# Patient Record
Sex: Female | Born: 1995 | Race: Black or African American | Hispanic: No | Marital: Single | Smoking: Never smoker
Health system: Southern US, Community
[De-identification: ages and names within clinical notes are randomized; demographics above are authoritative.]

## PROBLEM LIST (undated history)

## (undated) DIAGNOSIS — L709 Acne, unspecified: Secondary | ICD-10-CM

## (undated) DIAGNOSIS — J309 Allergic rhinitis, unspecified: Secondary | ICD-10-CM

## (undated) HISTORY — PX: NO PAST SURGERIES: SHX2092

## (undated) HISTORY — DX: Acne, unspecified: L70.9

## (undated) HISTORY — DX: Allergic rhinitis, unspecified: J30.9

---
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
## 2016-01-15 ENCOUNTER — Emergency Department: Payer: No Typology Code available for payment source

## 2016-01-15 ENCOUNTER — Encounter: Payer: Self-pay | Admitting: Emergency Medicine

## 2016-01-15 ENCOUNTER — Emergency Department
Admission: EM | Admit: 2016-01-15 | Discharge: 2016-01-15 | Disposition: A | Discharge status: Home / Self Care | Payer: No Typology Code available for payment source | Attending: Emergency Medicine | Admitting: Emergency Medicine

## 2016-01-15 DIAGNOSIS — Y9389 Activity, other specified: Secondary | ICD-10-CM | POA: Insufficient documentation

## 2016-01-15 DIAGNOSIS — S40021A Contusion of right upper arm, initial encounter: Secondary | ICD-10-CM | POA: Insufficient documentation

## 2016-01-15 DIAGNOSIS — Y999 Unspecified external cause status: Secondary | ICD-10-CM | POA: Diagnosis not present

## 2016-01-15 DIAGNOSIS — S20212A Contusion of left front wall of thorax, initial encounter: Secondary | ICD-10-CM | POA: Diagnosis not present

## 2016-01-15 DIAGNOSIS — Y92411 Interstate highway as the place of occurrence of the external cause: Secondary | ICD-10-CM | POA: Diagnosis not present

## 2016-01-15 DIAGNOSIS — S299XXA Unspecified injury of thorax, initial encounter: Secondary | ICD-10-CM | POA: Diagnosis present

## 2016-01-15 MED ORDER — CYCLOBENZAPRINE HCL 5 MG PO TABS: 5.0000 mg | ORAL_TABLET | Freq: Three times a day (TID) | ORAL | 0 refills | Status: DC | PRN

## 2016-01-15 MED ORDER — NAPROXEN 500 MG PO TABS: 500.0000 mg | ORAL_TABLET | Freq: Two times a day (BID) | ORAL | 0 refills | Status: DC
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
## 2016-09-23 ENCOUNTER — Encounter: Payer: Self-pay | Admitting: Family Medicine

## 2016-09-23 ENCOUNTER — Ambulatory Visit (INDEPENDENT_AMBULATORY_CARE_PROVIDER_SITE_OTHER): Payer: 59 | Admitting: Family Medicine

## 2016-09-23 VITALS — BP 122/74 | HR 84 | Temp 98.8°F | Resp 16 | Wt 163.6 lb

## 2016-09-23 DIAGNOSIS — L723 Sebaceous cyst: Secondary | ICD-10-CM

## 2016-09-23 DIAGNOSIS — L02429 Furuncle of limb, unspecified: Secondary | ICD-10-CM | POA: Diagnosis not present

## 2016-09-23 MED ORDER — DOXYCYCLINE HYCLATE 100 MG PO TABS: 100.0000 mg | ORAL_TABLET | Freq: Two times a day (BID) | ORAL | 0 refills | Status: DC

## 2016-09-23 NOTE — Patient Instructions (Signed)
Use warm, wet compresses to inner thigh boil 2-3 x day. Monitor cyst under your arm. Let me know if you get tenderness or you wish a surgeon to check it out.

## 2016-09-23 NOTE — Progress Notes (Signed)
Subjective:     Patient ID: Brittany Bonilla, female   DOB: 07/23/1995, 21 y.o.   MRN: [id number]  HPI  Chief Complaint  Patient presents with  . Skin Problem    Patient comes in office today with complaints of bump underneath her left arm for the past week. Patient describes area a sensitive to touch and raised.   States it is not tender like previous bumps she has attributed to ingrown hairs. Also has a tender area in her left inner thigh area. States she will get recurrent bumps which she will squeeze and get pus out. No hx of or exposure to MRSA.   Review of Systems     Objective:   Physical Exam  Constitutional: She appears well-developed and well-nourished. No distress.  Skin:  Left axilla with 1 cm, mobile, non-tender, s.c mass c/w epidermoid cyst. No overlying erythema or drainage. Left inner thigh with evolving pustule, minimally tender to the touch       Assessment:    1. Sebaceous cyst of left axilla  2. Furuncle of thigh - doxycycline (VIBRA-TABS) 100 MG tablet; Take 1 tablet (100 mg total) by mouth 2 (two) times daily.  Dispense: 14 tablet; Refill: 0    Plan:    Discussed use of warm compresses to thigh and to monitor axilla cyst.

## 2017-01-27 DIAGNOSIS — H65119 Acute and subacute allergic otitis media (mucoid) (sanguinous) (serous), unspecified ear: Secondary | ICD-10-CM | POA: Diagnosis not present

## 2017-01-30 ENCOUNTER — Encounter: Payer: Self-pay | Admitting: Family Medicine

## 2017-01-30 ENCOUNTER — Ambulatory Visit: Payer: 59 | Admitting: Family Medicine

## 2017-01-30 VITALS — BP 100/70 | HR 81 | Temp 97.9°F | Resp 16 | Wt 162.8 lb

## 2017-01-30 DIAGNOSIS — B349 Viral infection, unspecified: Secondary | ICD-10-CM | POA: Diagnosis not present

## 2017-01-30 DIAGNOSIS — B309 Viral conjunctivitis, unspecified: Secondary | ICD-10-CM

## 2017-01-30 MED ORDER — SULFACETAMIDE SODIUM 10 % OP SOLN: 2.0000 [drp] | Freq: Four times a day (QID) | OPHTHALMIC | 0 refills | Status: DC

## 2017-01-30 MED ORDER — HYDROCODONE-HOMATROPINE 5-1.5 MG/5ML PO SYRP: ORAL_SOLUTION | ORAL | 0 refills | Status: DC

## 2017-01-30 NOTE — Progress Notes (Signed)
Subjective:     Patient ID: Brittany Bonilla, female   DOB: 03/16/1995, 21 y.o.   MRN: [id number] Chief Complaint  Patient presents with  . Cough    Patient comes in office today with complaints of cough and congestion for the past 3 days. Patient states that on Tuesday she was seen at student [facility] and diagnosed with a ear infection in her right ear, shortly after patient began to have symptoms of congestion, productive cough with yellow phlegm and redness and crusting of left eye. Patient reports that she was given antibiotic for ear but has not started it yet, she has been taking otc Nyquil for cough.    HPI Reports onset of cold sx on or about 9 days ago. States sinuses have improved with cough becoming more prominent in the last two days. Has started abx (? Amoxicillin) for right otitis with improvement but has also developed left eye redness and crusting.  Review of Systems     Objective:   Physical Exam  Constitutional: She appears well-developed and well-nourished. No distress.  Ears: R TM with mild injection, left TM WNL Eyes: left eye with mild scleral injection PERL; no discharge noted. Throat: no tonsillar enlargement or exudate Neck: no cervical adenopathy Lungs: clear     Assessment:    1. Acute viral syndrome: hydrocodone cough syrup  2. Acute viral conjunctivitis of left eye: Sulamyd Ophth. Solution 2 drops left eye 4 x day if eye not improving in the next 24 hours    Plan:    Discussed use of Mucinex D and frequent warm eye compresses Start eye abx if eye not improving in the next 24 hours. Continue abx previously provided.

## 2017-01-30 NOTE — Patient Instructions (Signed)
Start Mucinex D for congestion and to help your ear. Start frequent wet, warm eye compresses to your left eye. Let me know if you are not improving next week.

## 2017-06-19 ENCOUNTER — Other Ambulatory Visit: Payer: Self-pay | Admitting: Family Medicine

## 2017-06-19 ENCOUNTER — Ambulatory Visit: Payer: 59 | Admitting: Family Medicine

## 2017-06-19 ENCOUNTER — Encounter: Payer: Self-pay | Admitting: Family Medicine

## 2017-06-19 VITALS — BP 102/72 | HR 99 | Temp 98.9°F | Resp 15 | Wt 175.0 lb

## 2017-06-19 DIAGNOSIS — R319 Hematuria, unspecified: Secondary | ICD-10-CM | POA: Diagnosis not present

## 2017-06-19 LAB — POCT URINALYSIS DIPSTICK
BILIRUBIN UA: NEGATIVE
Glucose, UA: NEGATIVE
KETONES UA: NEGATIVE
Nitrite, UA: NEGATIVE
Protein, UA: NEGATIVE
Spec Grav, UA: <=1.005 — AB (ref 1.010–1.025)
Urobilinogen, UA: 0.2 E.U./dL
pH, UA: 6 (ref 5.0–8.0)

## 2017-06-19 LAB — POCT URINE PREGNANCY: Preg Test, Ur: NEGATIVE

## 2017-06-19 MED ORDER — DOXYCYCLINE HYCLATE 100 MG PO TABS: 100.0000 mg | ORAL_TABLET | Freq: Two times a day (BID) | ORAL | 0 refills | Status: DC

## 2017-06-19 NOTE — Patient Instructions (Signed)
We will call you with the test results early next week on your cell phone: [phone]

## 2017-06-19 NOTE — Progress Notes (Signed)
  Subjective:     Patient ID: Brittany Bonilla, female   DOB: 09/16/1995, 21 y.o.   MRN: [id number] Chief Complaint  Patient presents with  . Hematuria    Patient comes in office today with concerns of hematuria for the past two weeks. Patient states at the end or emptying her bladder she notices blood. Patient denies pelvic pain, back pain, fever , dysuria or frequency.    HPI States she has noticed bright red blood at end urination every 2-3 days. Currently sexually active without protection. She is pending her menses, no vaginal discharge Reports negative STD screen in February.  Review of Systems     Objective:   Physical Exam  Constitutional: She appears well-developed and well-nourished. No distress.       Assessment:    1. Hematuria, unspecified type - Chlamydia/Gonococcus/Trichomonas, NAA - POCT urine pregnancy - POCT urinalysis dipstick - doxycycline (VIBRA-TABS) 100 MG tablet; Take 1 tablet (100 mg total) by mouth 2 (two) times daily.  Dispense: 14 tablet; Refill: 0 - Urine Culture    Plan:    Further f/u pending lab results. Encourage use of condoms.

## 2017-06-21 LAB — URINE CULTURE: ORGANISM ID, BACTERIA: NO GROWTH

## 2017-06-23 ENCOUNTER — Telehealth: Payer: Self-pay

## 2017-06-23 ENCOUNTER — Other Ambulatory Visit: Payer: Self-pay | Admitting: Family Medicine

## 2017-06-23 LAB — CHLAMYDIA/GONOCOCCUS/TRICHOMONAS, NAA
Chlamydia by NAA: NEGATIVE
GONOCOCCUS BY NAA: NEGATIVE
Trich vag by NAA: POSITIVE — AB

## 2017-06-23 MED ORDER — METRONIDAZOLE 500 MG PO TABS: 500.0000 mg | ORAL_TABLET | Freq: Two times a day (BID) | ORAL | 0 refills | Status: DC

## 2017-06-23 NOTE — Telephone Encounter (Signed)
Pt advised as directed below.   Thanks,   -Laura  

## 2017-06-23 NOTE — Telephone Encounter (Signed)
-----   Message from Anola Gurney, [place] sent at 06/23/2017  5:13 PM EDT ----- I have sent in the metronidazole to cover this. Let her know this is usually sexually transmitted so her partner needs to be treated as well.

## 2017-08-22 ENCOUNTER — Emergency Department
Admission: EM | Admit: 2017-08-22 | Discharge: 2017-08-22 | Disposition: A | Discharge status: Home / Self Care | Payer: 59 | Attending: Emergency Medicine | Admitting: Emergency Medicine

## 2017-08-22 ENCOUNTER — Other Ambulatory Visit: Payer: Self-pay

## 2017-08-22 ENCOUNTER — Emergency Department: Payer: 59

## 2017-08-22 DIAGNOSIS — R091 Pleurisy: Secondary | ICD-10-CM | POA: Diagnosis not present

## 2017-08-22 DIAGNOSIS — R05 Cough: Secondary | ICD-10-CM | POA: Insufficient documentation

## 2017-08-22 DIAGNOSIS — R079 Chest pain, unspecified: Secondary | ICD-10-CM | POA: Diagnosis not present

## 2017-08-22 LAB — CBC
HEMATOCRIT: 38.7 % (ref 35.0–47.0)
Hemoglobin: 13 g/dL (ref 12.0–16.0)
MCH: 28.5 pg (ref 26.0–34.0)
MCHC: 33.6 g/dL (ref 32.0–36.0)
MCV: 84.9 fL (ref 80.0–100.0)
Platelets: 266 10*3/uL (ref 150–440)
RBC: 4.56 MIL/uL (ref 3.80–5.20)
RDW: 13.5 % (ref 11.5–14.5)
WBC: 7.6 10*3/uL (ref 3.6–11.0)

## 2017-08-22 LAB — POCT PREGNANCY, URINE: PREG TEST UR: NEGATIVE

## 2017-08-22 LAB — BASIC METABOLIC PANEL
Anion gap: 6 (ref 5–15)
BUN: 16 mg/dL (ref 6–20)
CHLORIDE: 108 mmol/L (ref 98–111)
CO2: 24 mmol/L (ref 22–32)
CREATININE: 0.81 mg/dL (ref 0.44–1.00)
Calcium: 9.3 mg/dL (ref 8.9–10.3)
GFR calc non Af Amer: >60 mL/min (ref >60)
Glucose, Bld: 96 mg/dL (ref 70–99)
POTASSIUM: 4.5 mmol/L (ref 3.5–5.1)
SODIUM: 138 mmol/L (ref 135–145)

## 2017-08-22 LAB — TROPONIN I

## 2017-08-22 MED ORDER — HYDROCOD POLST-CPM POLST ER 10-8 MG/5ML PO SUER
5.0000 mL | Freq: Once | ORAL | Status: AC
  Administered 2017-08-22: 5 mL via ORAL
  Filled 2017-08-22: qty 5

## 2017-08-22 MED ORDER — HYDROCOD POLST-CPM POLST ER 10-8 MG/5ML PO SUER: 5.0000 mL | Freq: Two times a day (BID) | ORAL | 0 refills | Status: DC | PRN

## 2017-08-22 NOTE — ED Provider Notes (Signed)
[facility] Emergency Department Provider Note   First MD Initiated Contact with Patient 08/22/17 [phone]     (approximate)  I have reviewed the triage vital signs and the nursing notes.   HISTORY  Chief Complaint Chest Pain    HPI Brittany Bonilla is a 22 y.o. female presents to the emergency department with nonradiating left-sided chest pain which began 6 hours before arrival.  Patient denies any dyspnea no diaphoresis.  Patient denies any palpitations.  Patient denies any nausea or vomiting.  Patient denies any aggravating or alleviating factors for her pain.  Patient does admit to cough which has been occurring for the past 3 days.  Patient denies any fever.  Patient states she attributes to cough to sleeping next to the [facility] while at school.   Past Medical History:  Diagnosis Date  . Acne   . Allergic rhinitis     Patient Active Problem List   Diagnosis Date Noted  . Acne 05/31/2015  . Hyperpigmentation of skin, postinflammatory 05/31/2015  . Allergic rhinitis 05/31/2015    Past Surgical History:  Procedure Laterality Date  . NO PAST SURGERIES      Prior to Admission medications   Medication Sig Start Date End Date Taking? Authorizing Provider  chlorpheniramine-HYDROcodone (TUSSIONEX PENNKINETIC ER) 10-8 MG/5ML SUER Take 5 mLs by mouth every 12 (twelve) hours as needed. 08/22/17   Darci CurrentBrown, [place] N, MD  doxycycline (VIBRA-TABS) 100 MG tablet Take 1 tablet (100 mg total) by mouth 2 (two) times daily. 06/19/17   Anola Gurneyhauvin, Robert, PA  ibuprofen (ADVIL,MOTRIN) 800 MG tablet  01/27/17   [provider]  metroNIDAZOLE (FLAGYL) 500 MG tablet Take 1 tablet (500 mg total) by mouth 2 (two) times daily. 06/23/17   Anola Gurneyhauvin, Robert, PA    Allergies No known drug allergies  Family History  Problem Relation Age of Onset  . Healthy Mother   . Allergic rhinitis Brother   . Eczema Brother   . Healthy Father     Social History Social History    Tobacco Use  . Smoking status: Never Smoker  . Smokeless tobacco: Never Used  Substance Use Topics  . Alcohol use: No  . Drug use: Not Bonilla file    Review of Systems Constitutional: No fever/chills Eyes: No visual changes. ENT: No sore throat. Cardiovascular: Denies chest pain. Respiratory: Denies shortness of breath.  Positive for cough Gastrointestinal: No abdominal pain.  No nausea, no vomiting.  No diarrhea.  No constipation. Genitourinary: Negative for dysuria. Musculoskeletal: Negative for neck pain.  Negative for back pain. Integumentary: Negative for rash. Neurological: Negative for headaches, focal weakness or numbness.   ____________________________________________   PHYSICAL EXAM:  VITAL SIGNS: ED Triage Vitals  Enc Vitals Group     BP 08/22/17 0328 110/70     Pulse Rate 08/22/17 0328 93     Resp 08/22/17 0328 20     Temp 08/22/17 0328 98.3 F (36.8 C)     Temp Source 08/22/17 0328 Oral     SpO2 08/22/17 0328 100 %     Weight 08/22/17 0146 77.1 kg (170 lb)     Height 08/22/17 0146 1.499 m (4\' 11" )     Head Circumference --      Peak Flow --      Pain Score 08/22/17 0146 6     Pain Loc --      Pain Edu? --      Excl. in GC? --  Constitutional: Alert and oriented. Well appearing and in no acute distress. Eyes: Conjunctivae are normal.  Head: Atraumatic. Mouth/Throat: Mucous membranes are moist. Oropharynx non-erythematous. Neck: No stridor.   Cardiovascular: Normal rate, regular rhythm. Good peripheral circulation. Grossly normal heart sounds. Respiratory: Normal respiratory effort.  No retractions. Lungs CTAB. Gastrointestinal: Soft and nontender. No distention.  Musculoskeletal: No lower extremity tenderness nor edema. No gross deformities of extremities. Neurologic:  Normal speech and language. No gross focal neurologic deficits are appreciated.  Skin:  Skin is warm, dry and intact. No rash  noted.   ____________________________________________   LABS (all labs ordered are listed, but only abnormal results are displayed)  Labs Reviewed  BASIC METABOLIC PANEL  CBC  TROPONIN I  POC URINE PREG, ED  POCT PREGNANCY, URINE   ____________________________________________  EKG ED ECG REPORT I, [place] N Ulyess Muto, the attending physician, personally viewed and interpreted this ECG.   Date: 08/22/2017  EKG Time: 1:48 AM  rate: 89  Rhythm: Normal sinus rhythm  Axis: Normal  Intervals: Normal  ST&T Change: None  ____________________________________________  RADIOLOGY I, [place] N Bee Hammerschmidt, personally viewed and evaluated these images (plain radiographs) as part of my medical decision making, as well as reviewing the written report by the radiologist.  ED MD interpretation: No acute findings noted Bonilla chest x-ray per radiologist  Official radiology report(s): Dg Chest 2 View  Result Date: 08/22/2017 CLINICAL DATA:  22 year old female with chest pain. EXAM: CHEST - 2 VIEW COMPARISON:  Chest radiograph dated 01/15/2016 FINDINGS: The heart size and mediastinal contours are within normal limits. Both lungs are clear. The visualized skeletal structures are unremarkable. IMPRESSION: No active cardiopulmonary disease. Electronically Signed   By: Elgie Collard M.D.   Bonilla: 08/22/2017 02:35     Procedures   ____________________________________________   INITIAL IMPRESSION / ASSESSMENT AND PLAN / ED COURSE  As part of my medical decision making, I reviewed the following data within the electronic MEDICAL RECORD NUMBER   22 year old female presenting with above-stated history and physical exam of left side chest pain.  EKG revealed no evidence of ischemia or infarction or arrhythmia.  Laboratory data unremarkable chest x-ray likewise negative.  Suspect possible pleurisy as etiology for the patient's chest discomfort.  Patient given Tussionex in the emergency department Bonilla reevaluation  patient's chest pain 1-2 out of 10 ____________________________________________  FINAL CLINICAL IMPRESSION(S) / ED DIAGNOSES  Final diagnoses:  Pleurisy     MEDICATIONS GIVEN DURING THIS VISIT:  Medications  chlorpheniramine-HYDROcodone (TUSSIONEX) 10-8 MG/5ML suspension 5 mL (5 mLs Oral Given 08/22/17 0353)     ED Discharge Orders        Ordered    chlorpheniramine-HYDROcodone (TUSSIONEX PENNKINETIC ER) 10-8 MG/5ML SUER  Every 12 hours PRN     08/22/17 0438       Note:  This document was prepared using Dragon voice recognition software and may include unintentional dictation errors.    Darci Current, MD 08/22/17 [phone]

## 2017-08-22 NOTE — ED Triage Notes (Signed)
Pt arrives to ED via POV from home with c/o chest pain x6 hrs PTA. Pt reports left-sided chest pain without radiation. No N/V, no SHOB, no diaphoresis. Pt is A&O, in NAD; RR even, regular, and unlabored. No cardiac h/x.

## 2017-10-13 ENCOUNTER — Ambulatory Visit (INDEPENDENT_AMBULATORY_CARE_PROVIDER_SITE_OTHER): Payer: 59 | Admitting: Family Medicine

## 2017-10-13 DIAGNOSIS — Z23 Encounter for immunization: Secondary | ICD-10-CM | POA: Diagnosis not present

## 2017-11-27 DIAGNOSIS — Z124 Encounter for screening for malignant neoplasm of cervix: Secondary | ICD-10-CM | POA: Diagnosis not present

## 2018-05-12 ENCOUNTER — Ambulatory Visit: Payer: 59 | Admitting: Physician Assistant

## 2018-05-12 ENCOUNTER — Other Ambulatory Visit: Payer: Self-pay

## 2018-05-12 ENCOUNTER — Encounter: Payer: Self-pay | Admitting: Physician Assistant

## 2018-05-12 VITALS — BP 121/85 | HR 88 | Temp 98.9°F | Resp 16 | Wt 175.2 lb

## 2018-05-12 DIAGNOSIS — T7840XA Allergy, unspecified, initial encounter: Secondary | ICD-10-CM

## 2018-05-12 NOTE — Patient Instructions (Addendum)
Please schedule CPE with PAP smear.  Take xyzal or other allergy medication daily. Take flonase 2 sprays each nostril daily.   Health Maintenance, Female Adopting a healthy lifestyle and getting preventive care can go a long way to promote health and wellness. Talk with your health care provider about what schedule of regular examinations is right for you. This is a good chance for you to check in with your provider about disease prevention and staying healthy. In between checkups, there are plenty of things you can do on your own. Experts have done a lot of research about which lifestyle changes and preventive measures are most likely to keep you healthy. Ask your health care provider for more information. Weight and diet Eat a healthy diet  Be sure to include plenty of vegetables, fruits, low-fat dairy products, and lean protein.  Do not eat a lot of foods high in solid fats, added sugars, or salt.  Get regular exercise. This is one of the most important things you can do for your health. ? Most adults should exercise for at least 150 minutes each week. The exercise should increase your heart rate and make you sweat (moderate-intensity exercise). ? Most adults should also do strengthening exercises at least twice a week. This is in addition to the moderate-intensity exercise. Maintain a healthy weight  Body mass index (BMI) is a measurement that can be used to identify possible weight problems. It estimates body fat based on height and weight. Your health care provider can help determine your BMI and help you achieve or maintain a healthy weight.  For females 80 years of age and older: ? A BMI below 18.5 is considered underweight. ? A BMI of 18.5 to 24.9 is normal. ? A BMI of 25 to 29.9 is considered overweight. ? A BMI of 30 and above is considered obese. Watch levels of cholesterol and blood lipids  You should start having your blood tested for lipids and cholesterol at 22 years of  age, then have this test every 5 years.  You may need to have your cholesterol levels checked more often if: ? Your lipid or cholesterol levels are high. ? You are older than 22 years of age. ? You are at high risk for heart disease. Cancer screening Lung Cancer  Lung cancer screening is recommended for adults 51-39 years old who are at high risk for lung cancer because of a history of smoking.  A yearly low-dose CT scan of the lungs is recommended for people who: ? Currently smoke. ? Have quit within the past 15 years. ? Have at least a 30-pack-year history of smoking. A pack year is smoking an average of one pack of cigarettes a day for 1 year.  Yearly screening should continue until it has been 15 years since you quit.  Yearly screening should stop if you develop a health problem that would prevent you from having lung cancer treatment. Breast Cancer  Practice breast self-awareness. This means understanding how your breasts normally appear and feel.  It also means doing regular breast self-exams. Let your health care provider know about any changes, no matter how small.  If you are in your 20s or 30s, you should have a clinical breast exam (CBE) by a health care provider every 1-3 years as part of a regular health exam.  If you are 42 or older, have a CBE every year. Also consider having a breast X-ray (mammogram) every year.  If you have a family history  of breast cancer, talk to your health care provider about genetic screening.  If you are at high risk for breast cancer, talk to your health care provider about having an MRI and a mammogram every year.  Breast cancer gene (BRCA) assessment is recommended for women who have family members with BRCA-related cancers. BRCA-related cancers include: ? Breast. ? Ovarian. ? Tubal. ? Peritoneal cancers.  Results of the assessment will determine the need for genetic counseling and BRCA1 and BRCA2 testing. Cervical Cancer Your  health care provider may recommend that you be screened regularly for cancer of the pelvic organs (ovaries, uterus, and vagina). This screening involves a pelvic examination, including checking for microscopic changes to the surface of your cervix (Pap test). You may be encouraged to have this screening done every 3 years, beginning at age 46.  For women ages 65-65, health care providers may recommend pelvic exams and Pap testing every 3 years, or they may recommend the Pap and pelvic exam, combined with testing for human papilloma virus (HPV), every 5 years. Some types of HPV increase your risk of cervical cancer. Testing for HPV may also be done on women of any age with unclear Pap test results.  Other health care providers may not recommend any screening for nonpregnant women who are considered low risk for pelvic cancer and who do not have symptoms. Ask your health care provider if a screening pelvic exam is right for you.  If you have had past treatment for cervical cancer or a condition that could lead to cancer, you need Pap tests and screening for cancer for at least 20 years after your treatment. If Pap tests have been discontinued, your risk factors (such as having a new sexual partner) need to be reassessed to determine if screening should resume. Some women have medical problems that increase the chance of getting cervical cancer. In these cases, your health care provider may recommend more frequent screening and Pap tests. Colorectal Cancer  This type of cancer can be detected and often prevented.  Routine colorectal cancer screening usually begins at 22 years of age and continues through 22 years of age.  Your health care provider may recommend screening at an earlier age if you have risk factors for colon cancer.  Your health care provider may also recommend using home test kits to check for hidden blood in the stool.  A small camera at the end of a tube can be used to examine your  colon directly (sigmoidoscopy or colonoscopy). This is done to check for the earliest forms of colorectal cancer.  Routine screening usually begins at age 86.  Direct examination of the colon should be repeated every 5-10 years through 22 years of age. However, you may need to be screened more often if early forms of precancerous polyps or small growths are found. Skin Cancer  Check your skin from head to toe regularly.  Tell your health care provider about any new moles or changes in moles, especially if there is a change in a mole's shape or color.  Also tell your health care provider if you have a mole that is larger than the size of a pencil eraser.  Always use sunscreen. Apply sunscreen liberally and repeatedly throughout the day.  Protect yourself by wearing long sleeves, pants, a wide-brimmed hat, and sunglasses whenever you are outside. Heart disease, diabetes, and high blood pressure  High blood pressure causes heart disease and increases the risk of stroke. High blood pressure is more  likely to develop in: ? People who have blood pressure in the high end of the normal range (130-139/85-89 mm Hg). ? People who are overweight or obese. ? People who are African American.  If you are 92-78 years of age, have your blood pressure checked every 3-5 years. If you are 29 years of age or older, have your blood pressure checked every year. You should have your blood pressure measured twice--once when you are at a hospital or clinic, and once when you are not at a hospital or clinic. Record the average of the two measurements. To check your blood pressure when you are not at a hospital or clinic, you can use: ? An automated blood pressure machine at a pharmacy. ? A home blood pressure monitor.  If you are between 12 years and 45 years old, ask your health care provider if you should take aspirin to prevent strokes.  Have regular diabetes screenings. This involves taking a blood sample to  check your fasting blood sugar level. ? If you are at a normal weight and have a low risk for diabetes, have this test once every three years after 22 years of age. ? If you are overweight and have a high risk for diabetes, consider being tested at a younger age or more often. Preventing infection Hepatitis B  If you have a higher risk for hepatitis B, you should be screened for this virus. You are considered at high risk for hepatitis B if: ? You were born in a country where hepatitis B is common. Ask your health care provider which countries are considered high risk. ? Your parents were born in a high-risk country, and you have not been immunized against hepatitis B (hepatitis B vaccine). ? You have HIV or AIDS. ? You use needles to inject street drugs. ? You live with someone who has hepatitis B. ? You have had sex with someone who has hepatitis B. ? You get hemodialysis treatment. ? You take certain medicines for conditions, including cancer, organ transplantation, and autoimmune conditions. Hepatitis C  Blood testing is recommended for: ? Everyone born from [birth year] through 1964. ? Anyone with known risk factors for hepatitis C. Sexually transmitted infections (STIs)  You should be screened for sexually transmitted infections (STIs) including gonorrhea and chlamydia if: ? You are sexually active and are younger than 22 years of age. ? You are older than 22 years of age and your health care provider tells you that you are at risk for this type of infection. ? Your sexual activity has changed since you were last screened and you are at an increased risk for chlamydia or gonorrhea. Ask your health care provider if you are at risk.  If you do not have HIV, but are at risk, it may be recommended that you take a prescription medicine daily to prevent HIV infection. This is called pre-exposure prophylaxis (PrEP). You are considered at risk if: ? You are sexually active and do not regularly use  condoms or know the HIV status of your partner(s). ? You take drugs by injection. ? You are sexually active with a partner who has HIV. Talk with your health care provider about whether you are at high risk of being infected with HIV. If you choose to begin PrEP, you should first be tested for HIV. You should then be tested every 3 months for as long as you are taking PrEP. Pregnancy  If you are premenopausal and you may become pregnant, ask  your health care provider about preconception counseling.  If you may become pregnant, take 400 to 800 micrograms (mcg) of folic acid every day.  If you want to prevent pregnancy, talk to your health care provider about birth control (contraception). Osteoporosis and menopause  Osteoporosis is a disease in which the bones lose minerals and strength with aging. This can result in serious bone fractures. Your risk for osteoporosis can be identified using a bone density scan.  If you are 94 years of age or older, or if you are at risk for osteoporosis and fractures, ask your health care provider if you should be screened.  Ask your health care provider whether you should take a calcium or vitamin D supplement to lower your risk for osteoporosis.  Menopause may have certain physical symptoms and risks.  Hormone replacement therapy may reduce some of these symptoms and risks. Talk to your health care provider about whether hormone replacement therapy is right for you. Follow these instructions at home:  Schedule regular health, dental, and eye exams.  Stay current with your immunizations.  Do not use any tobacco products including cigarettes, chewing tobacco, or electronic cigarettes.  If you are pregnant, do not drink alcohol.  If you are breastfeeding, limit how much and how often you drink alcohol.  Limit alcohol intake to no more than 1 drink per day for nonpregnant women. One drink equals 12 ounces of beer, 5 ounces of wine, or 1 ounces of  hard liquor.  Do not use street drugs.  Do not share needles.  Ask your health care provider for help if you need support or information about quitting drugs.  Tell your health care provider if you often feel depressed.  Tell your health care provider if you have ever been abused or do not feel safe at home. This information is not intended to replace advice given to you by your health care provider. Make sure you discuss any questions you have with your health care provider. Document Released: 08/26/2010 Document Revised: 07/19/2015 Document Reviewed: 11/14/2014 Elsevier Interactive Patient Education  2019 [employer].
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
## 2018-09-29 ENCOUNTER — Other Ambulatory Visit (HOSPITAL_COMMUNITY)
Admission: RE | Admit: 2018-09-29 | Discharge: 2018-09-29 | Disposition: A | Discharge status: Home / Self Care | Payer: 59 | Source: Ambulatory Visit | Attending: Physician Assistant | Admitting: Physician Assistant

## 2018-09-29 ENCOUNTER — Other Ambulatory Visit: Payer: Self-pay

## 2018-09-29 ENCOUNTER — Encounter: Payer: Self-pay | Admitting: Physician Assistant

## 2018-09-29 ENCOUNTER — Ambulatory Visit: Payer: 59 | Admitting: Physician Assistant

## 2018-09-29 VITALS — BP 104/68 | HR 66 | Temp 98.0°F | Resp 16 | Ht 60.0 in | Wt 172.0 lb

## 2018-09-29 DIAGNOSIS — Z6833 Body mass index (BMI) 33.0-33.9, adult: Secondary | ICD-10-CM

## 2018-09-29 DIAGNOSIS — Z113 Encounter for screening for infections with a predominantly sexual mode of transmission: Secondary | ICD-10-CM

## 2018-09-29 DIAGNOSIS — N898 Other specified noninflammatory disorders of vagina: Secondary | ICD-10-CM | POA: Insufficient documentation

## 2018-09-29 DIAGNOSIS — E6609 Other obesity due to excess calories: Secondary | ICD-10-CM

## 2018-09-29 DIAGNOSIS — N760 Acute vaginitis: Secondary | ICD-10-CM | POA: Diagnosis not present

## 2018-09-29 DIAGNOSIS — A6004 Herpesviral vulvovaginitis: Secondary | ICD-10-CM

## 2018-09-29 DIAGNOSIS — B9689 Other specified bacterial agents as the cause of diseases classified elsewhere: Secondary | ICD-10-CM

## 2018-09-29 LAB — POCT URINALYSIS DIPSTICK
Appearance: ABNORMAL
Bilirubin, UA: NEGATIVE
Glucose, UA: NEGATIVE
Ketones, UA: NEGATIVE
Leukocytes, UA: NEGATIVE
Nitrite, UA: NEGATIVE
Odor: ABNORMAL
Protein, UA: POSITIVE — AB
Spec Grav, UA: 1.02 (ref 1.010–1.025)
Urobilinogen, UA: 0.2 E.U./dL
pH, UA: 6.5 (ref 5.0–8.0)

## 2018-09-29 NOTE — Patient Instructions (Signed)

## 2018-09-29 NOTE — Progress Notes (Signed)
Patient: Brittany GobbleSteva C Bonilla Female    DOB: 01/01/1996   22 y.o.   MRN: [id number] Visit Date: 09/29/2018  Today's Provider: Margaretann LovelessJennifer M Pardo, PA-C   No chief complaint on file.  Subjective:     HPI   Patient is here for STD screening. Patient states she has an odor. No discharge, no pain, and no rash. Patient states she had a normal menstrual period in July. However she took a plan B on 09/24/2018 and starting another menstrual period 09/26/2018. She did have unprotected sexual encounter.  No Known Allergies   Current Outpatient Medications:  .  ibuprofen (ADVIL,MOTRIN) 800 MG tablet, , Disp: , Rfl:   Review of Systems  Constitutional: Negative for appetite change, chills, fatigue and fever.  Respiratory: Negative for chest tightness and shortness of breath.   Cardiovascular: Negative for chest pain and palpitations.  Gastrointestinal: Negative for abdominal pain, nausea and vomiting.  Genitourinary: Positive for vaginal bleeding. Negative for dysuria, flank pain, frequency, genital sores, menstrual problem, vaginal discharge and vaginal pain.       Vaginal odor  Neurological: Negative for dizziness and weakness.    Social History   Tobacco Use  . Smoking status: Never Smoker  . Smokeless tobacco: Never Used  Substance Use Topics  . Alcohol use: No      Objective:   BP 104/68 (BP Location: Right Arm, Patient Position: Sitting, Cuff Size: Large)   Pulse 66   Temp 98 F (36.7 C) (Oral)   Resp 16   Ht 5' (1.524 m)   Wt 172 lb (78 kg)   LMP 09/29/2018   SpO2 97%   BMI 33.59 kg/m  Vitals:   09/29/18 1056  BP: 104/68  Pulse: 66  Resp: 16  Temp: 98 F (36.7 C)  TempSrc: Oral  SpO2: 97%  Weight: 172 lb (78 kg)  Height: 5' (1.524 m)     Physical Exam Vitals signs reviewed.  Constitutional:      General: She is not in acute distress.    Appearance: Normal appearance. She is well-developed. She is obese. She is not ill-appearing or diaphoretic.  Neck:      Musculoskeletal: Normal range of motion and neck supple.     Thyroid: No thyromegaly.     Vascular: No JVD.     Trachea: No tracheal deviation.  Cardiovascular:     Rate and Rhythm: Normal rate and regular rhythm.     Heart sounds: Normal heart sounds. No murmur. No friction rub. No gallop.   Pulmonary:     Effort: Pulmonary effort is normal. No respiratory distress.     Breath sounds: Normal breath sounds. No wheezing or rales.  Genitourinary:    Exam position: Supine.     Pubic Area: No rash.      Labia:        Right: Lesion present. No rash, tenderness or injury.        Left: No rash, tenderness, lesion or injury.      Vagina: Vaginal discharge (brown discharge) present.    Lymphadenopathy:     Cervical: No cervical adenopathy.  Neurological:     Mental Status: She is alert.      Results for orders placed or performed in visit on 09/29/18  POCT Urinalysis Dipstick  Result Value Ref Range   Color, UA Dark Yellow    Clarity, UA Cloudy    Glucose, UA Negative Negative   Bilirubin, UA Neg  Ketones, UA Neg    Spec Grav, UA 1.020 1.010 - 1.025   Blood, UA Large    pH, UA 6.5 5.0 - 8.0   Protein, UA Positive (A) Negative   Urobilinogen, UA 0.2 0.2 or 1.0 E.U./dL   Nitrite, UA Neg    Leukocytes, UA Negative Negative   Appearance Abnormal    Odor Abnormal        Assessment & Plan    1. Vaginal odor Nuswab collected for GC/chlamydia, trich, BV, and yeast testing. UA normal.  Herpes swab collected over vaginal lesion. Also screening for HIV and syphillis. I will f/u pending results.  - Cervicovaginal ancillary only  2. Screen for STD (sexually transmitted disease) See above medical treatment plan. - Cervicovaginal ancillary only - HIV antibody (with reflex) - RPR - Cervicovaginal ancillary only  3. Class 1 obesity due to excess calories without serious comorbidity with body mass index (BMI) of 33.0 to 33.9 in adult Counseled patient on healthy lifestyle  modifications including dieting and exercise.   4. Vaginal lesion Herpes viral culture obtained. I will f/u pending labs.  - Cervicovaginal ancillary only     Mar Daring, PA-C  [facility]

## 2018-09-29 NOTE — Addendum Note (Signed)
Addended by: Mar Daring on: 09/29/2018 04:14 PM   Modules accepted: Orders

## 2018-09-30 ENCOUNTER — Telehealth: Payer: Self-pay

## 2018-09-30 LAB — RPR: RPR Ser Ql: NONREACTIVE

## 2018-09-30 LAB — HIV ANTIBODY (ROUTINE TESTING W REFLEX): HIV Screen 4th Generation wRfx: NONREACTIVE

## 2018-09-30 NOTE — Telephone Encounter (Signed)
Patient notified of lab results

## 2018-09-30 NOTE — Telephone Encounter (Signed)
-----   Message from Mar Daring, [place] sent at 09/30/2018  9:15 AM EDT ----- HIV and syphilis screen are both negative. Other labs (swabs) are still pending

## 2018-10-02 LAB — CERVICOVAGINAL ANCILLARY ONLY
Candida vaginitis: NEGATIVE
Chlamydia: NEGATIVE
Neisseria Gonorrhea: NEGATIVE
Trichomonas: NEGATIVE

## 2018-10-03 LAB — HERPES SIMPLEX VIRUS(HSV) DNA BY PCR
HSV 1 DNA: NEGATIVE
HSV 2 DNA: POSITIVE — AB

## 2018-10-05 ENCOUNTER — Telehealth: Payer: Self-pay

## 2018-10-05 ENCOUNTER — Telehealth: Payer: Self-pay | Admitting: Physician Assistant

## 2018-10-05 DIAGNOSIS — A6004 Herpesviral vulvovaginitis: Secondary | ICD-10-CM | POA: Insufficient documentation

## 2018-10-05 MED ORDER — METRONIDAZOLE 500 MG PO TABS: 500.0000 mg | ORAL_TABLET | Freq: Two times a day (BID) | ORAL | 0 refills | Status: AC

## 2018-10-05 MED ORDER — VALACYCLOVIR HCL 1 G PO TABS: 1000.0000 mg | ORAL_TABLET | Freq: Two times a day (BID) | ORAL | 0 refills | Status: DC

## 2018-10-05 NOTE — Addendum Note (Signed)
Addended by: Mar Daring on: 10/05/2018 01:17 PM   Modules accepted: Orders

## 2018-10-05 NOTE — Telephone Encounter (Signed)
Patient calling to get results of STD test

## 2018-10-05 NOTE — Telephone Encounter (Signed)
Patient was advised about resulted labs. Please advised results still pending?

## 2018-10-05 NOTE — Telephone Encounter (Signed)
-----   Message from Mar Daring, PA-C sent at 10/05/2018  1:16 PM EDT ----- Swabs were positive for bacterial vaginosis. This is an overgrowth of normal vaginal bacteria and treated with metronidazole, an antibiotic. I will send this in for you. The other swab was also positive for genital herpes. This is treated with an antiviral called valtrex. I will send this in for you as well. Once we treat the initial outbreak (the bump on the external vaginal wall) we will see how quickly it returns, if it returns at all. If it returns rather quickly I would recommend doing suppressive therapy where you stay on Valtrex daily. If it does not return for a while we can just treat outbreaks as they arise. I will send both medications to pharmacy we have on file for you.

## 2018-10-05 NOTE — Telephone Encounter (Signed)
Viewed by [name] on 10/05/2018 1:16 PM

## 2018-10-05 NOTE — Telephone Encounter (Signed)
See result note.  

## 2018-10-13 ENCOUNTER — Telehealth: Payer: Self-pay | Admitting: Physician Assistant

## 2018-10-13 DIAGNOSIS — T3695XA Adverse effect of unspecified systemic antibiotic, initial encounter: Secondary | ICD-10-CM

## 2018-10-13 DIAGNOSIS — B379 Candidiasis, unspecified: Secondary | ICD-10-CM

## 2018-10-13 MED ORDER — FLUCONAZOLE 150 MG PO TABS: 150.0000 mg | ORAL_TABLET | Freq: Once | ORAL | 0 refills | Status: AC

## 2018-10-13 NOTE — Telephone Encounter (Signed)
Metronidazole is an antibiotic and may be causing yeast infection. Will send in Diflucan.

## 2018-10-13 NOTE — Telephone Encounter (Signed)
Patient advised.

## 2018-10-13 NOTE — Telephone Encounter (Signed)
Patient reports that she is not sure which medicine is causing the discomfort. She is having irritation with walking , her underwear or any cloth in that area bothers her, she reports that is swollen and that she has been icing it for the past 30 minutes. She is having a lot of white discharge. She had sexual intercourse a couple of days ago but couldn't continue because of the irritation.

## 2018-10-13 NOTE — Telephone Encounter (Signed)
Pt needing a call back regarding her new medication.  Causing a lot of vaginal discomfort vaginally.  Please call pt back at [phone].  Thanks, [employer]

## 2018-11-18 ENCOUNTER — Other Ambulatory Visit: Payer: Self-pay

## 2018-11-18 DIAGNOSIS — Z20822 Contact with and (suspected) exposure to covid-19: Secondary | ICD-10-CM

## 2018-11-20 LAB — NOVEL CORONAVIRUS, NAA: SARS-CoV-2, NAA: NOT DETECTED

## 2019-01-03 ENCOUNTER — Other Ambulatory Visit: Payer: Self-pay

## 2019-01-03 DIAGNOSIS — Z20822 Contact with and (suspected) exposure to covid-19: Secondary | ICD-10-CM

## 2019-01-04 LAB — NOVEL CORONAVIRUS, NAA: SARS-CoV-2, NAA: NOT DETECTED

## 2019-02-07 NOTE — Progress Notes (Signed)
Patient: Brittany Bonilla Female    DOB: [birth year]   23 y.o.   MRN: [id number] Visit Date: 02/08/2019  Today's Provider: Mar Daring, PA-C   Chief Complaint  Patient presents with  . Headache   Subjective:     Virtual Visit via Telephone Note  I connected with [name] on 02/08/19 at  3:40 PM EST by telephone and verified that I am speaking with the correct person using two identifiers.  Location: Patient: Home Provider: [facility]   I discussed the limitations, risks, security and privacy concerns of performing an evaluation and management service by telephone and the availability of in person appointments. I also discussed with the patient that there may be a patient responsible charge related to this service. The patient expressed understanding and agreed to proceed.   HPI  Patient with c/o stress and headaches. She has been having headaches off and on for a couple of weeks. Reports that this headache is really bad specially with movement of her head. Feels like her head is really heavy. Reports that light makes it worst and dark room makes it feel better.She has tried IBU 800 and rest. Pain is present in the posterior scalp. Has mild photophobia with headache. Denies nausea, vomiting or dizziness. No visual changes (blurred or double vision). No neurological deficit noted. Possible family history of migraines in mother, patient unsure. No personal history of migraines. No correlation with foods or menstrual cycle. 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   She is also complaining of a rash under her breast. Started last week. Is red and itches.   No Known Allergies   Current Outpatient Medications:  .  ibuprofen (ADVIL,MOTRIN) 800 MG tablet, , Disp: , Rfl:  .  metroNIDAZOLE (FLAGYL) 500 MG tablet, Take 1 tablet (500 mg total) by mouth 2 (two) times daily., Disp: 14 tablet, Rfl: 0 .  valACYclovir  (VALTREX) 1000 MG tablet, Take 1 tablet (1,000 mg total) by mouth 2 (two) times daily., Disp: 20 tablet, Rfl: 0 .  baclofen (LIORESAL) 10 MG tablet, Take 1 tablet (10 mg total) by mouth 3 (three) times daily., Disp: 30 each, Rfl: 0 .  nystatin cream (MYCOSTATIN), Apply 1 application topically 2 (two) times daily., Disp: 30 g, Rfl: 0 .  predniSONE (DELTASONE) 20 MG tablet, Take 1 tablet (20 mg total) by mouth daily with breakfast., Disp: 5 tablet, Rfl: 0  Review of Systems  Constitutional: Positive for fatigue.  HENT: Negative.   Eyes: Positive for photophobia. Negative for visual disturbance.  Respiratory: Negative.   Cardiovascular: Negative.   Gastrointestinal: Negative for abdominal pain, nausea and vomiting.  Musculoskeletal: Positive for neck pain and neck stiffness.  Neurological: Positive for headaches. Negative for dizziness and light-headedness.    Social History   Tobacco Use  . Smoking status: Never Smoker  . Smokeless tobacco: Never Used  Substance Use Topics  . Alcohol use: No      Objective:   There were no vitals taken for this visit. There were no vitals filed for this visit.There is no height or weight on file to calculate BMI.   Physical Exam Vitals reviewed.  Constitutional:      General: She is not in acute distress. Pulmonary:     Effort: No respiratory distress.  Neurological:     Mental Status: She is alert.      No results  found for any visits on 02/08/19.     Assessment & Plan     1. Tension headache Suspect tension headaches. Will give prednisone and baclofen as below for current headache. Warm compresses to the neck, light neck stretches, and massage could be beneficial as well. She is requesting a leave of absence from her employer for a short time so as not to lose her job but be able to decompress. Advised she needs to file for FMLA and send paperwork for [place] to complete.  - predniSONE (DELTASONE) 20 MG tablet; Take 1 tablet (20 mg total)  by mouth daily with breakfast.  Dispense: 5 tablet; Refill: 0 - baclofen (LIORESAL) 10 MG tablet; Take 1 tablet (10 mg total) by mouth 3 (three) times daily.  Dispense: 30 each; Refill: 0  2. Intertrigo Noted under breast per patient. Will try Nystatin cream as below. Call if not improving.  - nystatin cream (MYCOSTATIN); Apply 1 application topically 2 (two) times daily.  Dispense: 30 g; Refill: 0   I discussed the assessment and treatment plan with the patient. The patient was provided an opportunity to ask questions and all were answered. The patient agreed with the plan and demonstrated an understanding of the instructions.   The patient was advised to call back or seek an in-person evaluation if the symptoms worsen or if the condition fails to improve as anticipated.  I provided 11 minutes of non-face-to-face time during this encounter.    Margaretann Loveless, PA-C  [facility]

## 2019-02-08 ENCOUNTER — Ambulatory Visit (INDEPENDENT_AMBULATORY_CARE_PROVIDER_SITE_OTHER): Payer: 59 | Admitting: Physician Assistant

## 2019-02-08 ENCOUNTER — Encounter: Payer: Self-pay | Admitting: Physician Assistant

## 2019-02-08 DIAGNOSIS — G44209 Tension-type headache, unspecified, not intractable: Secondary | ICD-10-CM

## 2019-02-08 DIAGNOSIS — L304 Erythema intertrigo: Secondary | ICD-10-CM

## 2019-02-08 MED ORDER — PREDNISONE 20 MG PO TABS: 20.0000 mg | ORAL_TABLET | Freq: Every day | ORAL | 0 refills | Status: AC

## 2019-02-08 MED ORDER — NYSTATIN 100000 UNIT/GM EX CREA: 1.0000 "application " | TOPICAL_CREAM | Freq: Two times a day (BID) | CUTANEOUS | 0 refills | Status: AC

## 2019-02-08 MED ORDER — BACLOFEN 10 MG PO TABS: 10.0000 mg | ORAL_TABLET | Freq: Three times a day (TID) | ORAL | 0 refills | Status: AC

## 2019-02-08 NOTE — Patient Instructions (Signed)
Tension Headache, Adult A tension headache is a feeling of pain, pressure, or aching in the head that is often felt over the front and sides of the head. The pain can be dull, or it can feel tight (constricting). There are two types of tension headache:  Episodic tension headache. This is when the headaches happen fewer than 15 days a month.  Chronic tension headache. This is when the headaches happen more than 15 days a month during a 3-month period. A tension headache can last from 30 minutes to several days. It is the most common kind of headache. Tension headaches are not normally associated with nausea or vomiting, and they do not get worse with physical activity. What are the causes? The exact cause of this condition is not known. Tension headaches are often triggered by stress, anxiety, or depression. Other triggers include:  Alcohol.  Too much caffeine or caffeine withdrawal.  Respiratory infections, such as colds, flu, or sinus infections.  Dental problems or teeth clenching.  Tiredness (fatigue).  Holding your head and neck in the same position for a long period of time, such as while using a computer.  Smoking.  Arthritis of the neck. What are the signs or symptoms? Symptoms of this condition include:  A feeling of pressure or tightness around the head.  Dull, aching head pain.  Pain over the front and sides of the head.  Tenderness in the muscles of the head, neck, and shoulders. How is this diagnosed? This condition may be diagnosed based on your symptoms, your medical history, and a physical exam. If your symptoms are severe or unusual, you may have imaging tests, such as a CT scan or an MRI of your head. Your vision may also be checked. How is this treated? This condition may be treated with lifestyle changes and with medicines that help relieve symptoms. Follow these instructions at home: Managing pain  Take over-the-counter and prescription medicines only as  told by your health care provider.  When you have a headache, lie down in a dark, quiet room.  If directed, apply ice to the head and neck: ? Put ice in a plastic bag. ? Place a towel between your skin and the bag. ? Leave the ice on for 20 minutes, 2-3 times a day.  If directed, apply heat to the back of your neck as often as told by your health care provider. Use the heat source that your health care provider recommends, such as a moist heat pack or a heating pad. ? Place a towel between your skin and the heat source. ? Leave the heat on for 20-30 minutes. ? Remove the heat if your skin turns bright red. This is especially important if you are unable to feel pain, heat, or cold. You may have a greater risk of getting burned. Eating and drinking  Eat meals on a regular schedule.  Limit alcohol intake to no more than 1 drink a day for nonpregnant women and 2 drinks a day for men. One drink equals 12 oz of beer, 5 oz of wine, or 1 oz of hard liquor.  Drink enough fluid to keep your urine pale yellow.  Decrease your caffeine intake, or stop using caffeine. Lifestyle  Get 7-9 hours of sleep each night, or get the amount of sleep recommended by your health care provider.  At bedtime, remove all electronic devices from your room. Electronic devices include computers, phones, and tablets.  Find ways to manage your stress. Some things   that can help relieve stress include: ? Exercise. ? Deep breathing exercises. ? Yoga. ? Listening to music. ? Positive mental imagery.  Try to sit up straight and avoid tensing your muscles.  Do not use any products that contain nicotine or tobacco, such as cigarettes and e-cigarettes. If you need help quitting, ask your health care provider. General instructions  Keep all follow-up visits as told by your health care provider. This is important.  Avoid any headache triggers. Keep a headache journal to help find out what may trigger your headaches.  For example, write down: ? What you eat and drink. ? How much sleep you get. ? Any change to your diet or medicines. Contact a health care provider if:  Your headache does not get better.  Your headache comes back.  You are sensitive to sounds, light, or smells because of a headache.  You have nausea or you vomit.  Your stomach hurts. Get help right away if:  You suddenly develop a very severe headache along with any of the following: ? A stiff neck. ? Nausea and vomiting. ? Confusion. ? Weakness. ? Double vision or loss of vision. ? Shortness of breath. ? Rash. ? Unusual sleepiness. ? Fever. ? Trouble speaking. ? Pain in your eyes or ears. ? Trouble walking or balancing. ? Feeling faint or passing out. Summary  A tension headache is a feeling of pain, pressure, or aching in the head that is often felt over the front and sides of the head.  A tension headache can last from 30 minutes to several days. It is the most common kind of headache.  This condition may be diagnosed based on your symptoms, your medical history, and a physical exam.  This condition may be treated with lifestyle changes and with medicines that help relieve symptoms. This information is not intended to replace advice given to you by your health care provider. Make sure you discuss any questions you have with your health care provider. Document Released: 02/10/2005 Document Revised: 01/23/2017 Document Reviewed: 05/23/2016 Elsevier Patient Education  2020 Elsevier Inc.  

## 2019-05-06 ENCOUNTER — Ambulatory Visit: Payer: 59 | Attending: Internal Medicine

## 2019-05-06 DIAGNOSIS — Z23 Encounter for immunization: Secondary | ICD-10-CM

## 2019-05-06 NOTE — Progress Notes (Signed)
   Covid-19 Vaccination Clinic  Name:  Brittany Bonilla    MRN: [id number] DOB: Mar 18, 1995  05/06/2019  Brittany Bonilla was observed post Covid-19 immunization for 15 minutes without incident. She was provided with Vaccine Information Sheet and instruction to access the V-Safe system.   Brittany Bonilla was instructed to call 911 with any severe reactions post vaccine: Marland Kitchen Difficulty breathing  . Swelling of face and throat  . A fast heartbeat  . A bad rash all over body  . Dizziness and weakness   Immunizations Administered    Name Date Dose VIS Date Route   Moderna COVID-19 Vaccine 05/06/2019 10:24 AM 0.5 mL 01/25/2019 Intramuscular   Manufacturer: Moderna   Lot: [id number]   NDC: 03128-118-86

## 2019-05-27 ENCOUNTER — Encounter (HOSPITAL_COMMUNITY): Payer: Self-pay

## 2019-05-27 ENCOUNTER — Emergency Department (HOSPITAL_COMMUNITY)
Admission: EM | Admit: 2019-05-27 | Discharge: 2019-05-27 | Disposition: A | Discharge status: Home / Self Care | Payer: 59 | Attending: Emergency Medicine | Admitting: Emergency Medicine

## 2019-05-27 ENCOUNTER — Ambulatory Visit (INDEPENDENT_AMBULATORY_CARE_PROVIDER_SITE_OTHER): Payer: 59 | Admitting: Physician Assistant

## 2019-05-27 ENCOUNTER — Encounter: Payer: Self-pay | Admitting: Physician Assistant

## 2019-05-27 ENCOUNTER — Other Ambulatory Visit: Payer: Self-pay

## 2019-05-27 VITALS — BP 102/60 | HR 77 | Temp 97.4°F | Resp 16 | Ht 59.0 in | Wt 192.0 lb

## 2019-05-27 DIAGNOSIS — S0181XA Laceration without foreign body of other part of head, initial encounter: Secondary | ICD-10-CM

## 2019-05-27 DIAGNOSIS — W2209XA Striking against other stationary object, initial encounter: Secondary | ICD-10-CM | POA: Insufficient documentation

## 2019-05-27 DIAGNOSIS — Y999 Unspecified external cause status: Secondary | ICD-10-CM | POA: Insufficient documentation

## 2019-05-27 DIAGNOSIS — S01511A Laceration without foreign body of lip, initial encounter: Secondary | ICD-10-CM | POA: Insufficient documentation

## 2019-05-27 DIAGNOSIS — Y929 Unspecified place or not applicable: Secondary | ICD-10-CM | POA: Diagnosis not present

## 2019-05-27 DIAGNOSIS — Y939 Activity, unspecified: Secondary | ICD-10-CM | POA: Diagnosis not present

## 2019-05-27 MED ORDER — IBUPROFEN 800 MG PO TABS: 800.0000 mg | ORAL_TABLET | Freq: Three times a day (TID) | ORAL | 0 refills | Status: AC | PRN

## 2019-05-27 NOTE — Patient Instructions (Signed)
Laceration Care, Adult A laceration is a cut that may go through all layers of the skin. The cut may also go into the tissue that is right under the skin. Some cuts heal on their own. Others need to be closed with stitches (sutures), staples, skin adhesive strips, or skin glue. Taking care of your injury lowers your risk of infection, helps your injury to heal better, and may prevent scarring. Supplies needed:  Soap.  Water.  Hand sanitizer.  Bandage (dressing).  Antibiotic ointment.  Clean towel. How to take care of your cut Wash your hands with soap and water before touching your wound or changing your bandage. If soap and water are not available, use hand sanitizer. If your doctor used stitches or staples:  Keep the wound clean and dry.  If you were given a bandage, change it at least once a day as told by your doctor. You should also change it if it gets wet or dirty.  Keep the wound completely dry for the first 24 hours, or as told by your doctor. After that, you may take a shower or a bath. Do not get the wound soaked in water until after the stitches or staples have been removed.  Clean the wound once a day, or as told by your doctor: ? Wash the wound with soap and water. ? Rinse the wound with water to remove all soap. ? Pat the wound dry with a clean towel. Do not rub the wound.  After you clean the wound, put a thin layer of antibiotic ointment on it as told by your doctor. This ointment: ? Helps to prevent infection. ? Keeps the bandage from sticking to the wound.  Have your stitches or staples removed as told by your doctor. If your doctor used skin adhesive strips:  Keep the wound clean and dry.  If you were given a bandage, you should change it at least once a day as told by your doctor. You should also change it if it gets wet or dirty.  Do not get the skin adhesive strips wet. You can take a shower or a bath, but keep the wound dry.  If the wound gets wet,  pat it dry with a clean towel. Do not rub the wound.  Skin adhesive strips fall off on their own. You can trim the strips as the wound heals. Do not remove any strips that are still stuck to the wound. They will fall off after a while. If your doctor used skin glue:  Try to keep your wound dry, but you may briefly wet it in the shower or bath. Do not soak the wound in water, such as by swimming.  After you take a shower or a bath, gently pat the wound dry with a clean towel. Do not rub the wound.  Do not do any activities that will make you really sweaty until the skin glue has fallen off on its own.  Do not apply liquid, cream, or ointment medicine to your wound while the skin glue is still on.  If you were given a bandage, you should change it at least once a day or as told by your doctor. You should also change it if it gets dirty or wet.  If a bandage is placed over the wound, do not let the tape touch the skin glue.  Do not pick at the glue. The skin glue usually stays on for 5-10 days. Then, it falls off the skin. General   instructions   Take over-the-counter and prescription medicines only as told by your doctor.  If you were given antibiotic medicine or ointment, take or apply it as told by your doctor. Do not stop using it even if your condition improves.  Do not scratch or pick at the wound.  Check your wound every day for signs of infection. Watch for: ? Redness, swelling, or pain. ? Fluid, blood, or pus.  Raise (elevate) the injured area above the level of your heart while you are sitting or lying down.  If directed, put ice on the affected area: ? Put ice in a plastic bag. ? Place a towel between your skin and the bag. ? Leave the ice on for 20 minutes, 2-3 times a day.  Prevent scarring by covering your wound with sunscreen of at least 30 SPF whenever you are outside after your wound has healed.  Keep all follow-up visits as told by your doctor. This is  important. Get help if:  You got a tetanus shot and you have any of these problems at the injection site: ? Swelling. ? Very bad pain. ? Redness. ? Bleeding.  You have a fever.  A wound that was closed breaks open.  You notice a bad smell coming from your wound or your bandage.  You notice something coming out of the wound, such as wood or glass.  Medicine does not relieve your pain.  You have more redness, swelling, or pain at the site of your wound.  You have fluid, blood, or pus coming from your wound.  You notice a change in the color of your skin near your wound.  You need to change the bandage often because fluid, blood, or pus is coming from the wound.  You start to have a new rash.  You start to have numbness around the wound. Get help right away if:  You have very bad swelling around the wound.  Your pain suddenly gets worse and is very bad.  You notice painful lumps near the wound or anywhere on your body.  You have a red streak going away from your wound.  The wound is on your hand or foot, and: ? You cannot move a finger or toe. ? Your fingers or toes look pale or bluish. Summary  A laceration is a cut that may go through all layers of the skin. The cut may also go into the tissue right under the skin.  Some cuts heal on their own. Others need to be closed with stitches, staples, skin adhesive strips, or skin glue.  Follow your doctor's instructions for caring for your cut. Proper care of a cut lowers the risk of infection, helps the cut heal better, and prevents scarring. This information is not intended to replace advice given to you by your health care provider. Make sure you discuss any questions you have with your health care provider. Document Revised: 04/10/2017 Document Reviewed: 03/02/2017 Elsevier Patient Education  2020 Elsevier Inc.  

## 2019-05-27 NOTE — ED Provider Notes (Addendum)
Patient would not allow me to take a history. "I should not have to explain myself".  EDP explained that I needed to take a history.  "I don't care who you are.  Someone should have taken notes already."  She refused to speak to me.  I did not examine the patient. The patient left       Render Marley, MD 05/27/19 [id number]

## 2019-05-27 NOTE — Progress Notes (Signed)
Patient: Brittany Bonilla Female    DOB: 01/07/1996   24 y.o.   MRN: [id number] Visit Date: 05/27/2019  Today's Provider: Mar Daring, PA-C   No chief complaint on file.  Subjective:     Fall The accident occurred 6 to 12 hours ago. The fall occurred while standing. She fell from a height of 1 to 2 ft. The volume of blood lost was minimal. The point of impact was the face. The pain is at a severity of 2/10. The pain is mild. The symptoms are aggravated by pressure on injury. Pertinent negatives include no headaches. She has tried rest and NSAID for the symptoms.   She went to [facility] ER and left without being evaluated by the doctor as she said she had told 3-4 nurses what happened and then the doctor acted like they didn't know anything and she did not want to repeat herself again.   No Known Allergies   Current Outpatient Medications:  .  baclofen (LIORESAL) 10 MG tablet, Take 1 tablet (10 mg total) by mouth 3 (three) times daily., Disp: 30 each, Rfl: 0 .  ibuprofen (ADVIL,MOTRIN) 800 MG tablet, , Disp: , Rfl:  .  metroNIDAZOLE (FLAGYL) 500 MG tablet, Take 1 tablet (500 mg total) by mouth 2 (two) times daily., Disp: 14 tablet, Rfl: 0 .  nystatin cream (MYCOSTATIN), Apply 1 application topically 2 (two) times daily., Disp: 30 g, Rfl: 0 .  predniSONE (DELTASONE) 20 MG tablet, Take 1 tablet (20 mg total) by mouth daily with breakfast., Disp: 5 tablet, Rfl: 0 .  valACYclovir (VALTREX) 1000 MG tablet, Take 1 tablet (1,000 mg total) by mouth 2 (two) times daily., Disp: 20 tablet, Rfl: 0  Review of Systems  Constitutional: Negative.   HENT: Positive for mouth sores.   Eyes: Negative.   Respiratory: Negative.   Cardiovascular: Negative.   Gastrointestinal: Negative.   Musculoskeletal: Negative.   Skin: Positive for wound.  Allergic/Immunologic: Negative.   Neurological: Negative for headaches.  Hematological: Negative.   Psychiatric/Behavioral: Negative.      Social History   Tobacco Use  . Smoking status: Never Smoker  . Smokeless tobacco: Never Used  Substance Use Topics  . Alcohol use: No      Objective:   There were no vitals taken for this visit. There were no vitals filed for this visit.There is no height or weight on file to calculate BMI.   Physical Exam Vitals reviewed.  Constitutional:      General: She is not in acute distress.    Appearance: Normal appearance. She is well-developed. She is obese. She is not ill-appearing.  HENT:     Head: Normocephalic and atraumatic.     Mouth/Throat:     Lips: Pink. No lesions.     Mouth: Mucous membranes are moist. Injury present. No oral lesions.     Tongue: No lesions.   Pulmonary:     Effort: Pulmonary effort is normal. No respiratory distress.  Musculoskeletal:     Cervical back: Normal range of motion and neck supple.  Neurological:     Mental Status: She is alert.  Psychiatric:        Mood and Affect: Mood normal.        Behavior: Behavior normal.        Thought Content: Thought content normal.        Judgment: Judgment normal.      No results found for any visits on  05/27/19.     Assessment & Plan    1. Lip laceration, initial encounter Most likely would have benefited from suturing in the ER, but patient did not stay. Advised patient of our office not having sutures available and that it would be impossible to find a plastic surgeon to refer to on a holiday, unfortunately. Discussed only bad outcome would be possibility of the small scar under the lip. She voiced understanding and agreed to have derma-bond placed on laceration to help hold closed so it would quit splitting open. Derma-bond applied without issue and IBU refilled for pain.  - ibuprofen (ADVIL) 800 MG tablet; Take 1 tablet (800 mg total) by mouth every 8 (eight) hours as needed.  Dispense: 30 tablet; Refill: 0     Margaretann Loveless, PA-C  [facility]

## 2019-05-27 NOTE — ED Notes (Signed)
This nurse walked into room to provide physician with dermabond, when pt yelled at physician stating " I should not have to tell you what happened again. I already told two nurse you should look at their notes, I should not have to repeat myself. 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 Pt became increasingly irritated and took off her blood pressure cuff as well as spo2 monitor and stormed out of the room, refusing to be assessed by physician and refusing any further treatment.

## 2019-05-27 NOTE — ED Triage Notes (Signed)
Patient arrived stating she ran into a door and cut under her lower lip. Bleeding is controlled.

## 2019-05-27 NOTE — ED Notes (Signed)
Pt states "I was drinking tonight but I wasn't drunk, I was trying to get out of my bed and I fell and hit the dresser door." Pt is visibly upset and crying. This nurse provided reassurance and assisted pt with icing her lip.  Pt had a laceration to right lower lip where her tooth had penetrated.

## 2019-05-30 ENCOUNTER — Encounter: Payer: Self-pay | Admitting: Physician Assistant

## 2019-06-07 ENCOUNTER — Ambulatory Visit: Payer: 59 | Attending: Internal Medicine

## 2019-06-07 DIAGNOSIS — Z23 Encounter for immunization: Secondary | ICD-10-CM

## 2019-06-07 NOTE — Progress Notes (Signed)
   Covid-19 Vaccination Clinic  Name:  Brittany Bonilla    MRN: [id number] DOB: 1995-06-12  06/07/2019  [name] was observed post Covid-19 immunization for 15 minutes without incident. She was provided with Vaccine Information Sheet and instruction to access the V-Safe system.   [name] was instructed to call 911 with any severe reactions post vaccine: Marland Kitchen Difficulty breathing  . Swelling of face and throat  . A fast heartbeat  . A bad rash all over body  . Dizziness and weakness   Immunizations Administered    Name Date Dose VIS Date Route   Moderna COVID-19 Vaccine 06/07/2019  9:17 AM 0.5 mL 01/25/2019 Intramuscular   Manufacturer: Moderna   Lot: [id number]   NDC: 15996-895-70

## 2019-09-01 ENCOUNTER — Ambulatory Visit: Payer: Self-pay

## 2019-09-01 NOTE — Telephone Encounter (Signed)
Called patient no answer but noticed that she is at the ED

## 2019-09-01 NOTE — Telephone Encounter (Signed)
Please advise if ok for in office visit?

## 2019-09-01 NOTE — Telephone Encounter (Signed)
Patient needs urgent evaluation for rectal bleeding. Recommend UC or ER

## 2019-09-01 NOTE — Telephone Encounter (Addendum)
Pt. Reports for 2 days she has had fatigue, chills. Unsure if she has a fever.Has lightheadedness. This morning had 2 bowel movements with bright red blood noted in toilet bowel and on stool. Request in person office visit. Offered virtual visit due to chills and fatigue. Declines.Instructed to got to ED for worsening of symptoms. Office closed for lunch. Please advise pt.  Answer Assessment - Initial Assessment Questions 1. APPEARANCE of BLOOD: "What color is it?" "Is it passed separately, on the surface of the stool, or mixed in with the stool?"      Mixed in stool 2. AMOUNT: "How much blood was passed?"      Moderate 3. FREQUENCY: "How many times has blood been passed with the stools?"      2 times 4. ONSET: "When was the blood first seen in the stools?" (Days or weeks)      Today 5. DIARRHEA: "Is there also some diarrhea?" If Yes, ask: "How many diarrhea stools were passed in past 24 hours?"      No diarrhea 6. CONSTIPATION: "Do you have constipation?" If Yes, ask: "How bad is it?"     2 days ago 7. RECURRENT SYMPTOMS: "Have you had blood in your stools before?" If Yes, ask: "When was the last time?" and "What happened that time?"      No 8. BLOOD THINNERS: "Do you take any blood thinners?" (e.g., Coumadin/warfarin, Pradaxa/dabigatran, aspirin)     No 9. OTHER SYMPTOMS: "Do you have any other symptoms?"  (e.g., abdominal pain, vomiting, dizziness, fever)     None now 10. PREGNANCY: "Is there any chance you are pregnant?" "When was your last menstrual period?"       No  Protocols used: RECTAL BLEEDING-A-AH

## 2019-12-21 ENCOUNTER — Other Ambulatory Visit: Payer: Self-pay | Admitting: Physician Assistant

## 2019-12-21 DIAGNOSIS — A6004 Herpesviral vulvovaginitis: Secondary | ICD-10-CM

## 2019-12-26 MED ORDER — VALACYCLOVIR HCL 1 G PO TABS: 1000.0000 mg | ORAL_TABLET | Freq: Two times a day (BID) | ORAL | 0 refills | Status: AC

## 2020-02-24 IMAGING — CR DG CHEST 2V
2 series · 2 of 2 positions shown · non-contrast
Comparison: Chest radiograph dated 01/15/2016

CLINICAL DATA: 21-year-old female with chest pain.

EXAM:
CHEST - 2 VIEW

[chest pa]
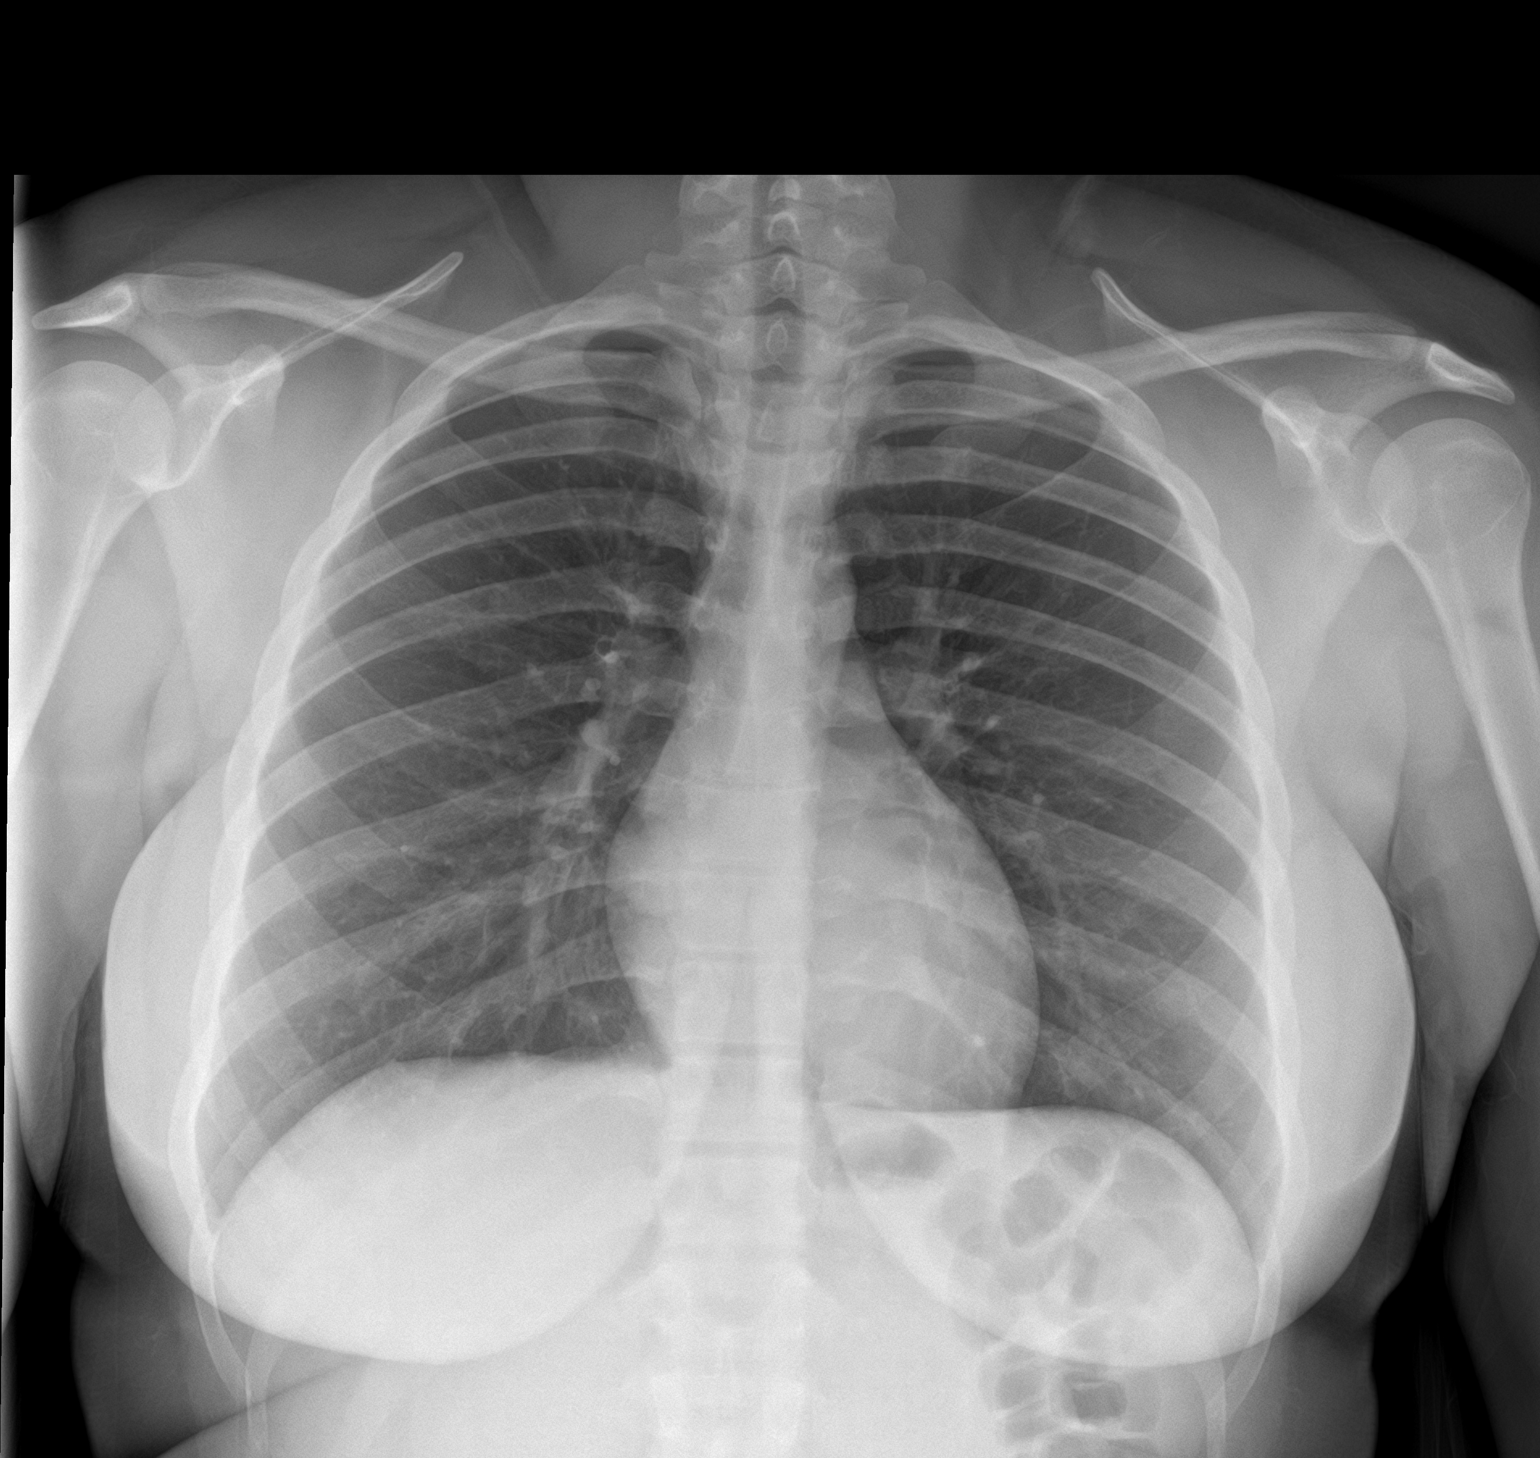

[chest lat]
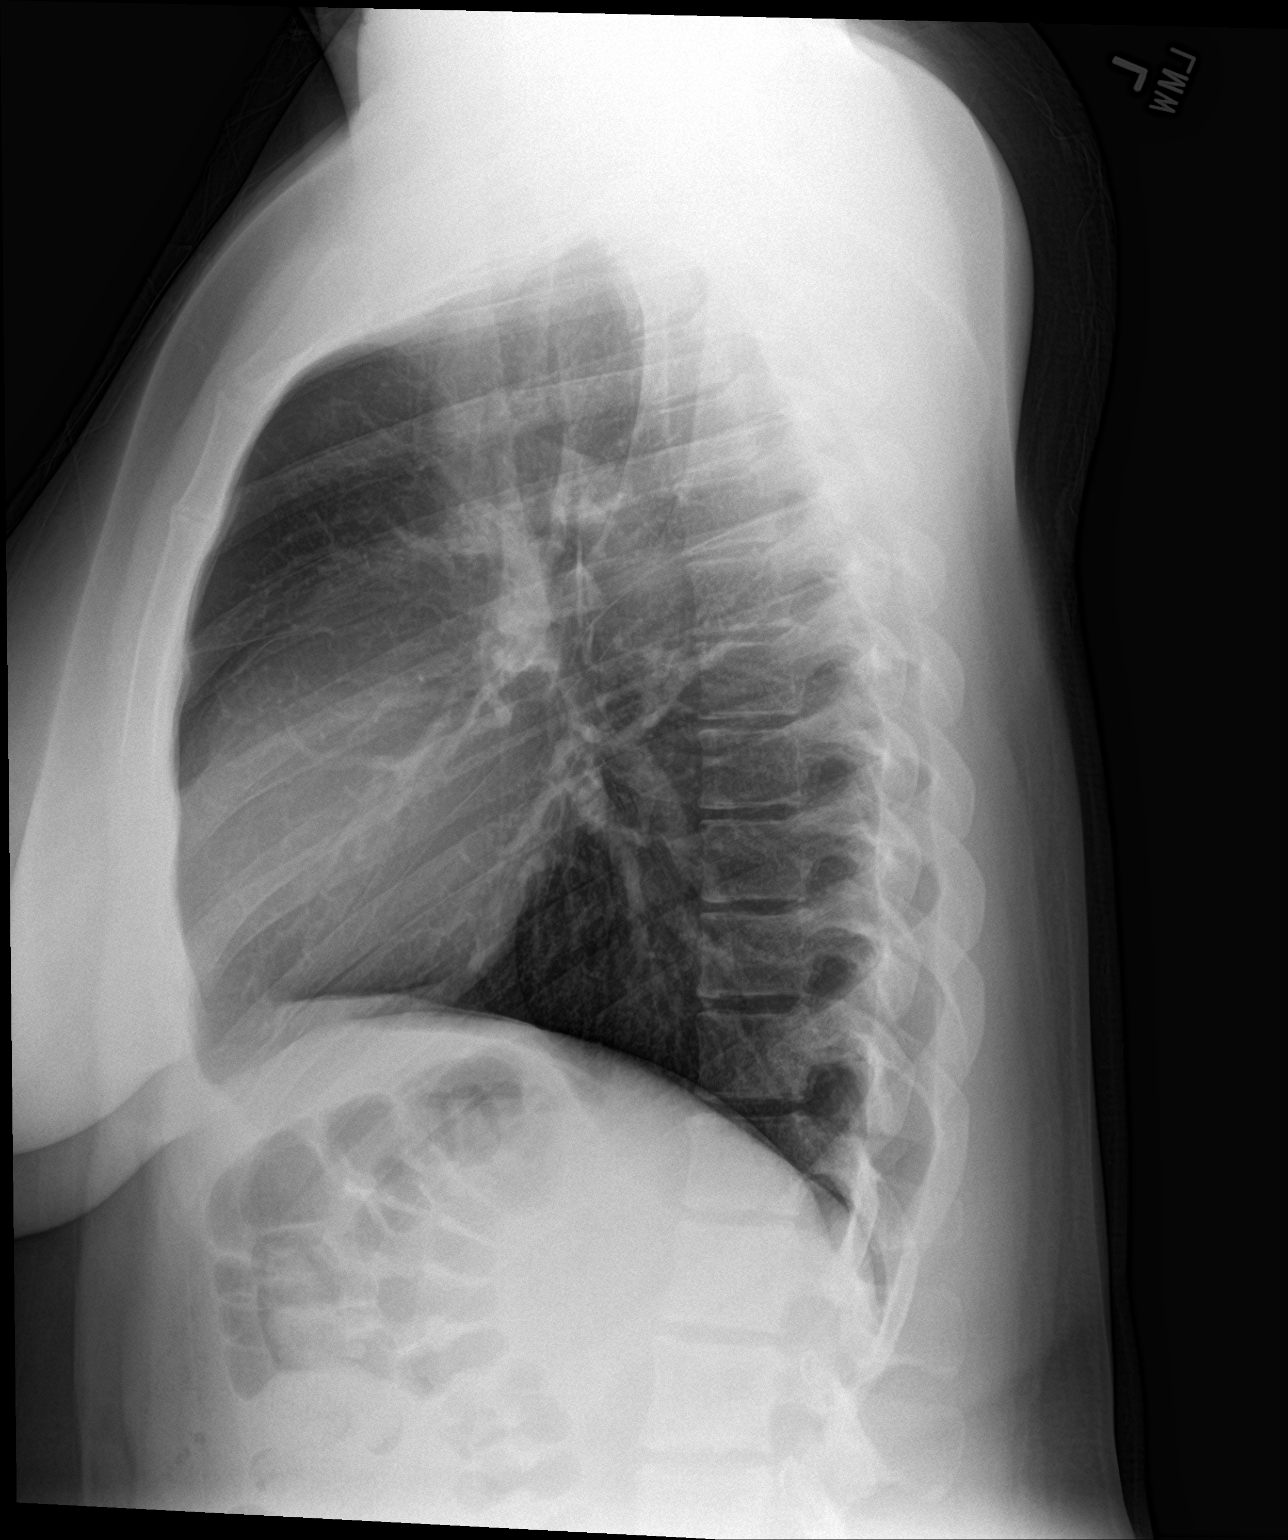

[2 of 2 positions shown; findings below may reference images not displayed]

FINDINGS: The heart size and mediastinal contours are within normal limits.
Both lungs are clear. The visualized skeletal structures are
unremarkable.
IMPRESSION: No active cardiopulmonary disease.
